# Patient Record
Sex: Male | Born: 2000 | Race: Black or African American | Hispanic: No | Marital: Single | Smoking: Never smoker
Health system: Southern US, Community
[De-identification: ages and names within clinical notes are randomized; demographics above are authoritative.]

---
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
## 2020-08-13 ENCOUNTER — Encounter (HOSPITAL_COMMUNITY): Payer: Self-pay | Admitting: Emergency Medicine

## 2020-08-13 ENCOUNTER — Emergency Department (HOSPITAL_COMMUNITY): Payer: Self-pay

## 2020-08-13 ENCOUNTER — Other Ambulatory Visit: Payer: Self-pay

## 2020-08-13 ENCOUNTER — Emergency Department (HOSPITAL_COMMUNITY)
Admission: EM | Admit: 2020-08-13 | Discharge: 2020-08-14 | Disposition: A | Discharge status: Home / Self Care | Payer: Self-pay | Attending: Emergency Medicine | Admitting: Emergency Medicine

## 2020-08-13 DIAGNOSIS — Y939 Activity, unspecified: Secondary | ICD-10-CM | POA: Insufficient documentation

## 2020-08-13 DIAGNOSIS — S0011XA Contusion of right eyelid and periocular area, initial encounter: Secondary | ICD-10-CM | POA: Insufficient documentation

## 2020-08-13 DIAGNOSIS — S81812A Laceration without foreign body, left lower leg, initial encounter: Secondary | ICD-10-CM | POA: Insufficient documentation

## 2020-08-13 DIAGNOSIS — Y929 Unspecified place or not applicable: Secondary | ICD-10-CM | POA: Insufficient documentation

## 2020-08-13 DIAGNOSIS — S0083XA Contusion of other part of head, initial encounter: Secondary | ICD-10-CM

## 2020-08-13 DIAGNOSIS — Y999 Unspecified external cause status: Secondary | ICD-10-CM | POA: Insufficient documentation

## 2020-08-13 DIAGNOSIS — Z23 Encounter for immunization: Secondary | ICD-10-CM | POA: Insufficient documentation

## 2020-08-13 DIAGNOSIS — R519 Headache, unspecified: Secondary | ICD-10-CM | POA: Insufficient documentation

## 2020-08-13 MED ORDER — TETANUS-DIPHTH-ACELL PERTUSSIS 5-2.5-18.5 LF-MCG/0.5 IM SUSY
0.5000 mL | PREFILLED_SYRINGE | Freq: Once | INTRAMUSCULAR | Status: AC
  Administered 2020-08-14: 0.5 mL via INTRAMUSCULAR
  Filled 2020-08-13: qty 0.5
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
## 2020-08-13 NOTE — ED Notes (Signed)
Pt mother called to get an update on patient; Haze Justin; [phone]

## 2020-08-14 MED ORDER — HYDROCODONE-ACETAMINOPHEN 5-325 MG PO TABS
1.0000 | ORAL_TABLET | Freq: Once | ORAL | Status: AC
  Administered 2020-08-14: 1 via ORAL
  Filled 2020-08-14: qty 1

## 2020-08-14 MED ORDER — IBUPROFEN 600 MG PO TABS: 600.0000 mg | ORAL_TABLET | Freq: Four times a day (QID) | ORAL | 0 refills | Status: AC | PRN

## 2020-08-14 MED ORDER — LIDOCAINE HCL (PF) 1 % IJ SOLN: 5.0000 mL | Freq: Once | INTRAMUSCULAR | Status: DC

## 2020-08-14 MED ORDER — HYDROCODONE-ACETAMINOPHEN 5-325 MG PO TABS: 1.0000 | ORAL_TABLET | ORAL | 0 refills | Status: AC | PRN

## 2020-08-14 MED ORDER — LIDOCAINE-EPINEPHRINE 1 %-1:100000 IJ SOLN
10.0000 mL | Freq: Once | INTRAMUSCULAR | Status: DC
  Administered 2020-08-14: 10 mL
  Filled 2020-08-14: qty 20
  Filled 2020-08-14: qty 10

## 2020-08-14 MED ORDER — LIDOCAINE-EPINEPHRINE (PF) 2 %-1:200000 IJ SOLN
10.0000 mL | Freq: Once | INTRAMUSCULAR | Status: DC
  Filled 2020-08-14: qty 10

## 2020-08-14 NOTE — Discharge Instructions (Addendum)
The staples will need to be removed in 10 days. You can go to any Urgent Care or return here to have this done.   Take medications as prescribed. Return here as needed for new or worsening symptoms.
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
## 2022-09-05 IMAGING — DX DG TIBIA/FIBULA 2V*L*
4 series · 4 of 4 positions shown · non-contrast
Comparison: None.

CLINICAL DATA: Left leg laceration

EXAM:
LEFT TIBIA AND FIBULA - 2 VIEW

[tibia ap (1 of 2)]
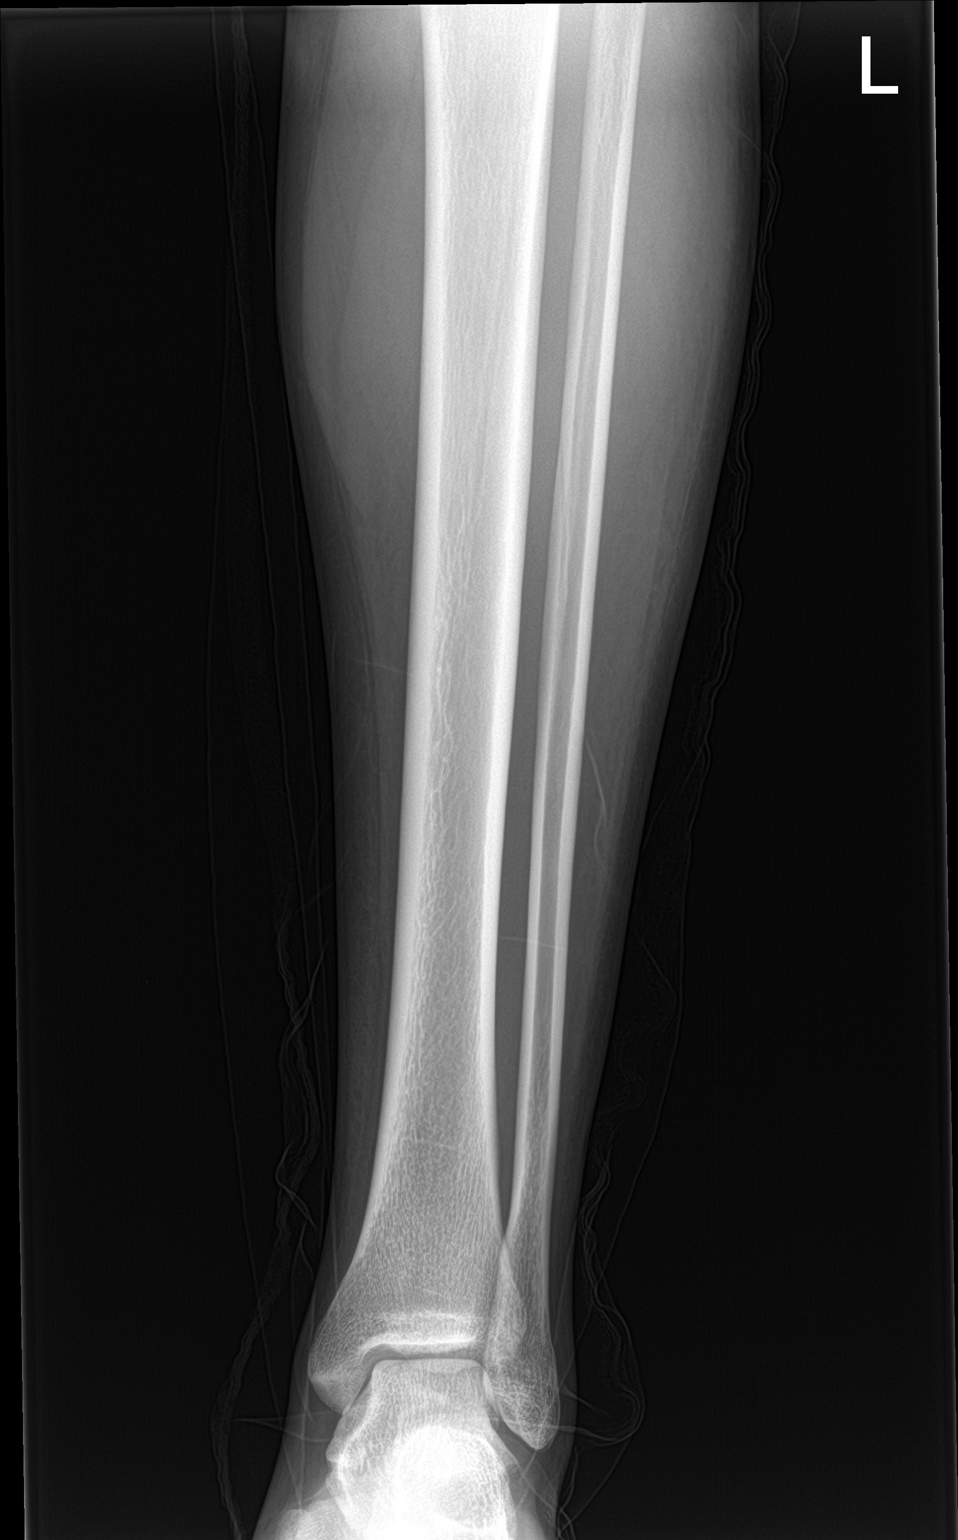

[tibia ap (2 of 2)]
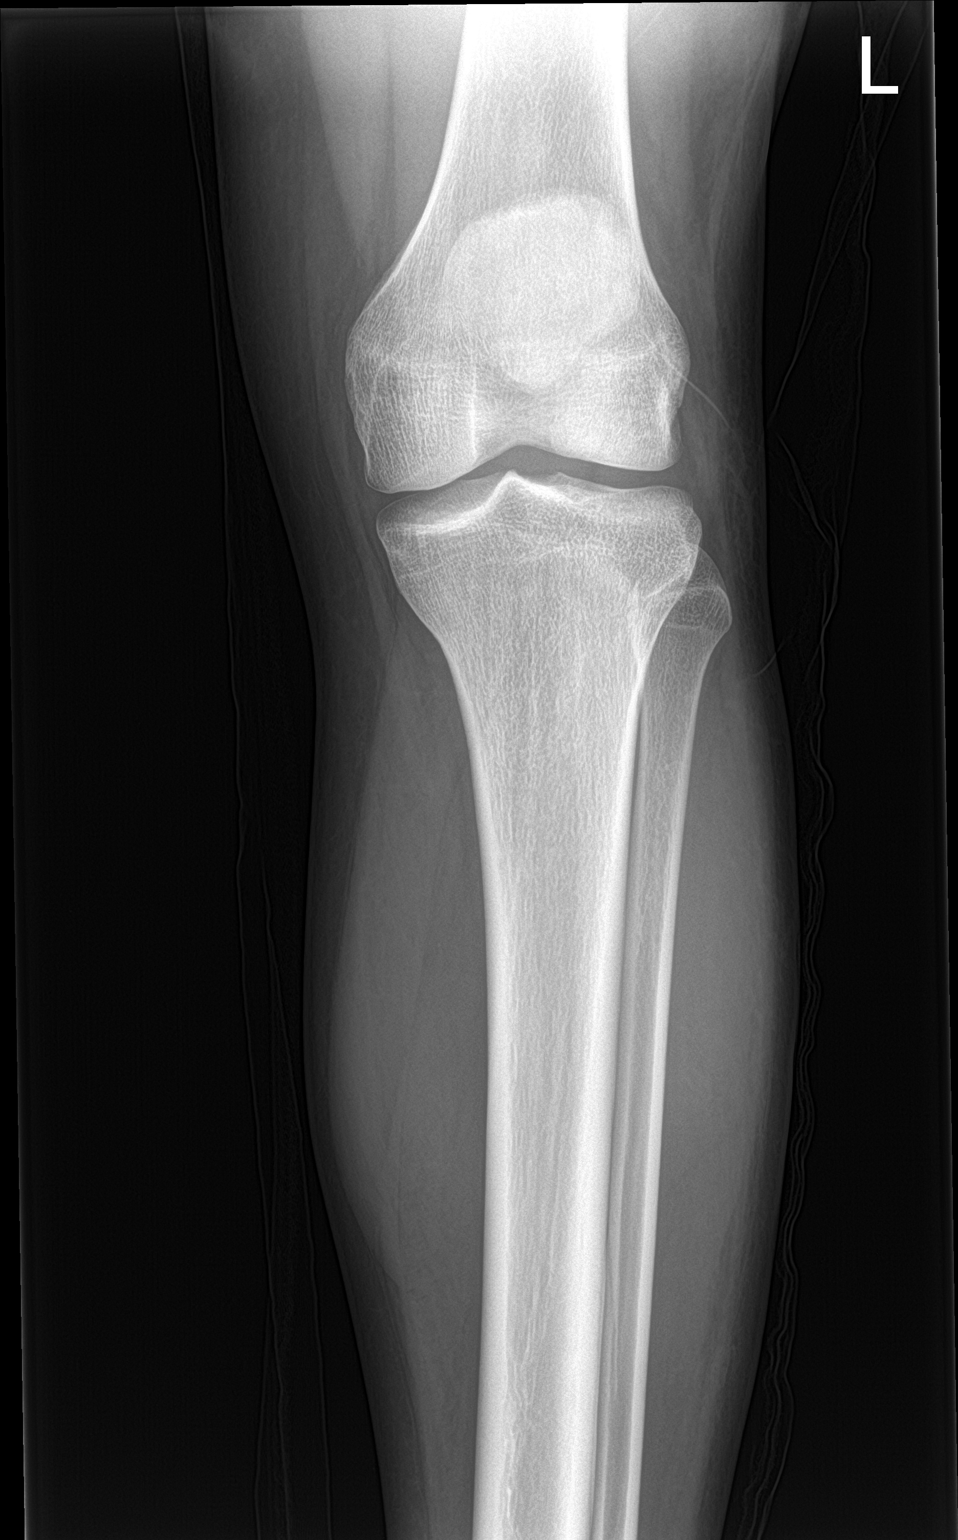

[tibia lat (1 of 2)]
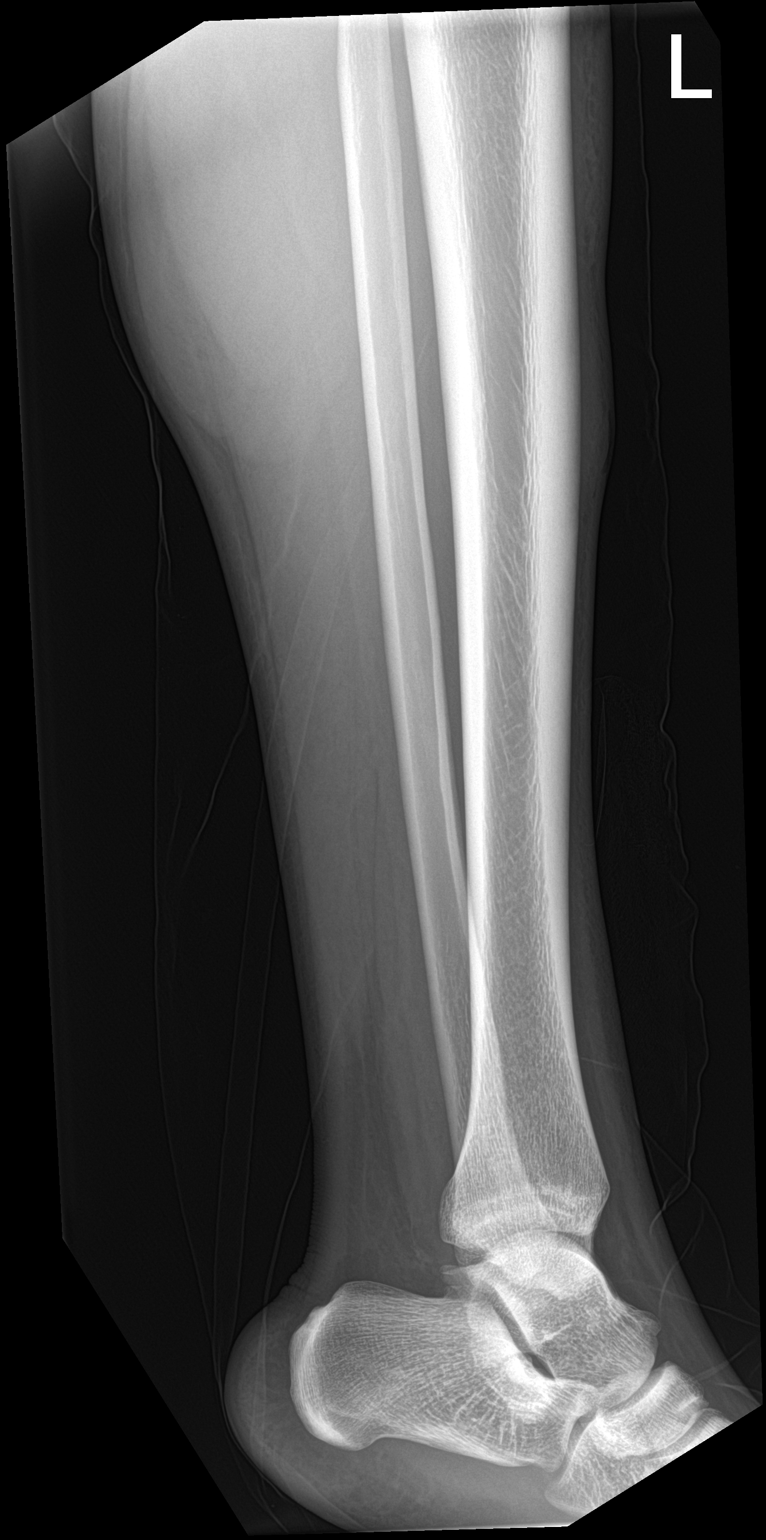

[tibia lat (2 of 2)]
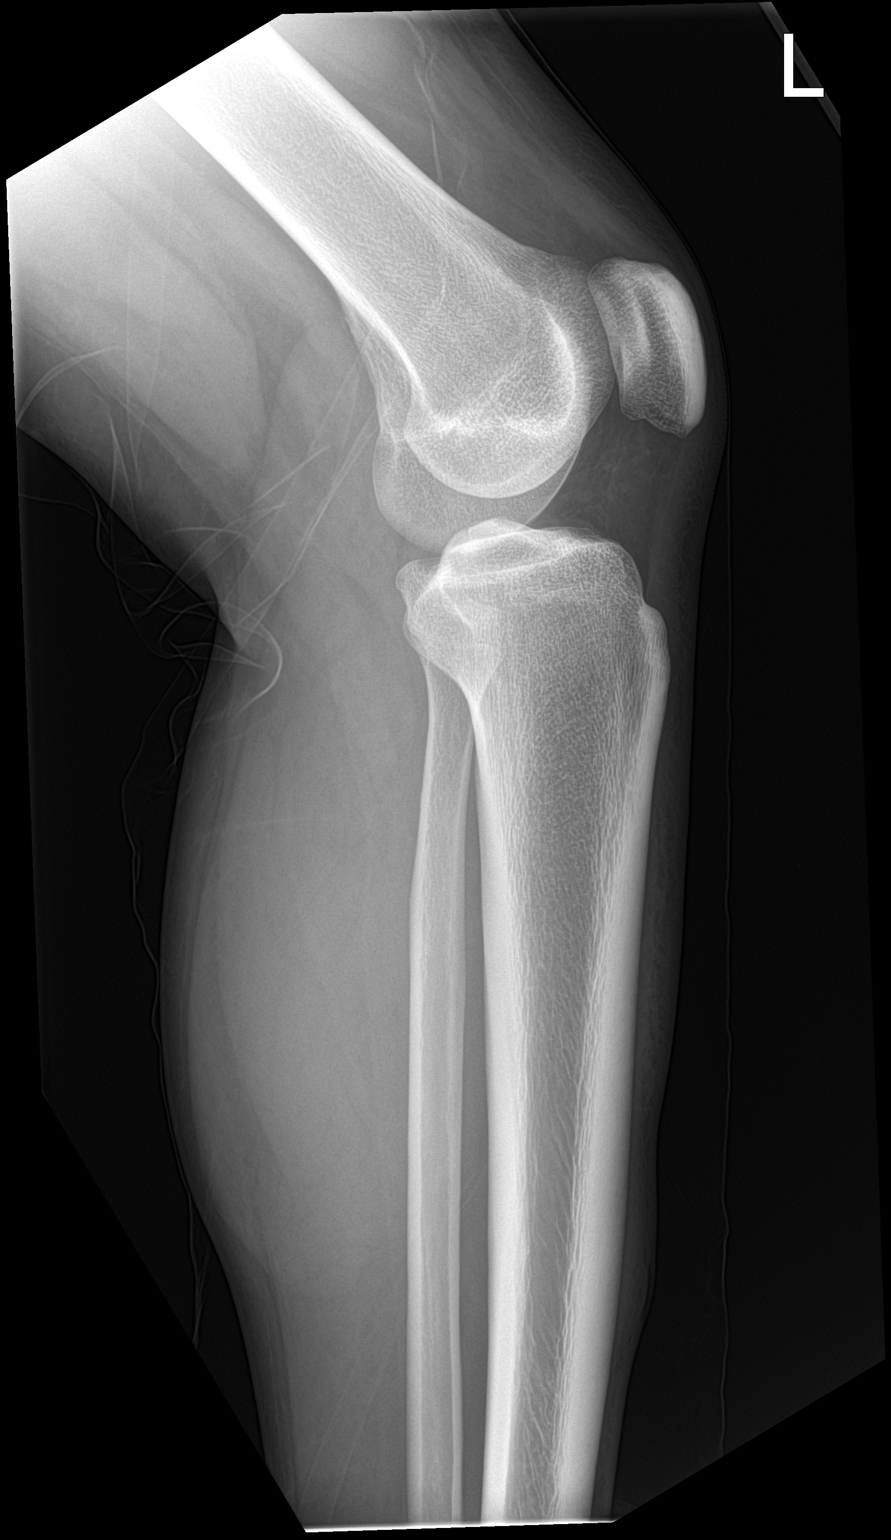

[4 of 4 positions shown; findings below may reference images not displayed]

FINDINGS: There is no evidence of fracture or other focal bone lesions. Soft
tissues are unremarkable.
IMPRESSION: Negative.
